# Patient Record
Sex: Female | Born: 1990 | Race: White | Hispanic: No | Marital: Single | Smoking: Never smoker
Health system: Southern US, Community
[De-identification: ages and names within clinical notes are randomized; demographics above are authoritative.]

## PROBLEM LIST (undated history)

## (undated) DIAGNOSIS — F909 Attention-deficit hyperactivity disorder, unspecified type: Secondary | ICD-10-CM

## (undated) DIAGNOSIS — T753XXA Motion sickness, initial encounter: Secondary | ICD-10-CM

## (undated) DIAGNOSIS — G43909 Migraine, unspecified, not intractable, without status migrainosus: Secondary | ICD-10-CM

## (undated) HISTORY — PX: TYMPANOSTOMY TUBE PLACEMENT: SHX32

## (undated) HISTORY — PX: ADENOIDECTOMY: SUR15

---
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
## 2012-02-04 ENCOUNTER — Emergency Department: Payer: Self-pay | Admitting: *Deleted

## 2012-02-04 LAB — CBC
HCT: 43.1 % (ref 35.0–47.0)
HGB: 14.5 g/dL (ref 12.0–16.0)
MCH: 32.2 pg (ref 26.0–34.0)
Platelet: 238 10*3/uL (ref 150–440)
RBC: 4.51 10*6/uL (ref 3.80–5.20)
RDW: 12.4 % (ref 11.5–14.5)
WBC: 7.1 10*3/uL (ref 3.6–11.0)

## 2012-02-04 LAB — COMPREHENSIVE METABOLIC PANEL
Alkaline Phosphatase: 51 U/L — ABNORMAL LOW (ref 82–169)
Bilirubin,Total: 0.6 mg/dL (ref 0.2–1.0)
Calcium, Total: 9 mg/dL (ref 9.0–10.7)
Chloride: 105 mmol/L (ref 97–107)
Creatinine: 0.71 mg/dL (ref 0.60–1.30)
Glucose: 82 mg/dL (ref 65–99)
Osmolality: 282 (ref 275–301)
Potassium: 3.9 mmol/L (ref 3.3–4.7)
SGOT(AST): 23 U/L (ref 0–26)
SGPT (ALT): 26 U/L

## 2012-02-05 LAB — URINALYSIS, COMPLETE
Bacteria: NONE SEEN
Bilirubin,UR: NEGATIVE
Blood: NEGATIVE
Glucose,UR: NEGATIVE mg/dL (ref 0–75)
Ketone: NEGATIVE
Leukocyte Esterase: NEGATIVE
Protein: NEGATIVE
RBC,UR: NONE SEEN /HPF (ref 0–5)
WBC UR: NONE SEEN /HPF (ref 0–5)

## 2012-02-05 LAB — PREGNANCY, URINE: Pregnancy Test, Urine: NEGATIVE m[IU]/mL

## 2012-02-22 ENCOUNTER — Ambulatory Visit: Payer: Self-pay | Admitting: Pediatrics

## 2012-07-12 ENCOUNTER — Ambulatory Visit: Payer: Self-pay | Admitting: General Surgery

## 2012-07-12 LAB — CBC WITH DIFFERENTIAL/PLATELET
Basophil #: 0 10*3/uL (ref 0.0–0.1)
HCT: 42.4 % (ref 35.0–47.0)
HGB: 14.5 g/dL (ref 12.0–16.0)
Lymphocyte %: 38.9 %
MCH: 32.1 pg (ref 26.0–34.0)
MCV: 94 fL (ref 80–100)
Monocyte %: 9.2 %
Neutrophil %: 49.2 %
RBC: 4.5 10*6/uL (ref 3.80–5.20)

## 2012-07-14 ENCOUNTER — Ambulatory Visit: Payer: Self-pay | Admitting: General Surgery

## 2012-07-14 HISTORY — PX: APPENDECTOMY: SHX54

## 2012-07-17 LAB — PATHOLOGY REPORT

## 2012-12-19 IMAGING — NM NUCLEAR MEDICINE HEPATOHBILIARY INCLUDE GB
2 series · 12 of 12 positions shown · non-contrast
Comparison: none

REASON FOR EXAM: abd pain
COMMENTS:

[Series 1000: gallbladder dynamic (results) · 4.80mm/px · 6 of 60 frames shown]
[frame 6/60]
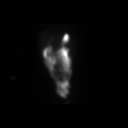
[frame 16/60]
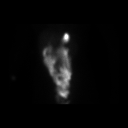
[frame 26/60]
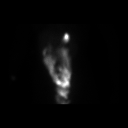
[frame 36/60]
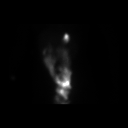
[frame 46/60]
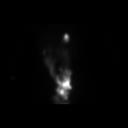
[frame 56/60]
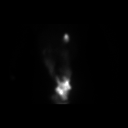

[Series 1000: gallbladder dynamic · 4.80mm/px · 6 of 60 frames shown]
[frame 6/60]
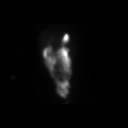
[frame 16/60]
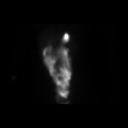
[frame 26/60]
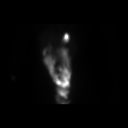
[frame 36/60]
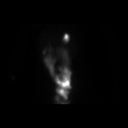
[frame 46/60]
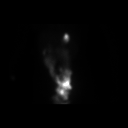
[frame 56/60]
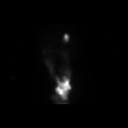

[12 of 12 positions shown; findings below may reference images not displayed]

PROCEDURE:     KNM - KNM HEPATO W/GB EJECT FRACTION  - February 22, 2012  [DATE]

RESULT:     Following intravenous administration of 8.08 mCi technetium 99m
Choletec, there is noted prompt visualization of tracer activity in the
liver at 3 minutes. At 30 minutes, tracer activity is visualized in the
gallbladder, common duct and proximal small bowel.

The gallbladder ejection fraction measures 84% which is in the normal range.
IMPRESSION: 1.  Normal Hepatobiliary Scan.
2.  The gallbladder ejection fraction measures 84% which is in the normal
range.

[REDACTED]

## 2017-03-24 NOTE — Discharge Instructions (Signed)
[facility] °[facility] °ENDOSCOPIC SINUS SURGERY °[facility] ° °What is Functional Endoscopic Sinus Surgery? ° The Surgery involves making the natural openings of the sinuses larger by removing the bony partitions that separate the sinuses from the nasal cavity.  The natural sinus lining is preserved as much as possible to allow the sinuses to resume normal function after the surgery.  In some patients nasal polyps (excessively swollen lining of the sinuses) may be removed to relieve obstruction of the sinus openings.  The surgery is performed through the nose using lighted scopes, which eliminates the need for incisions on the face.  A septoplasty is a different procedure which is sometimes performed with sinus surgery.  It involves straightening the boy partition that separates the two sides of your nose.  A crooked or deviated septum may need repair if is obstructing the sinuses or nasal airflow.  Turbinate reduction is also often performed during sinus surgery.  The turbinates are bony proturberances from the side walls of the nose which swell and can obstruct the nose in patients with sinus and allergy problems.  Their size can be surgically reduced to help relieve nasal obstruction. ° °What Can Sinus Surgery Do For Me? ° Sinus surgery can reduce the frequency of sinus infections requiring antibiotic treatment.  This can provide improvement in nasal congestion, post-nasal drainage, facial pressure and nasal obstruction.  Surgery will NOT prevent you from ever having an infection again, so it usually only for patients who get infections 4 or more times yearly requiring antibiotics, or for infections that do not clear with antibiotics.  It will not cure nasal allergies, so patients with allergies may still require medication to treat their allergies after surgery. Surgery may improve headaches related to sinusitis, however, some people will continue to  require medication to control sinus headaches related to allergies.  Surgery will do nothing for other forms of headache (migraine, tension or cluster). ° °What Are the Risks of Endoscopic Sinus Surgery? ° Current techniques allow surgery to be performed safely with little risk, however, there are rare complications that patients should be aware of.  Because the sinuses are located around the eyes, there is risk of eye injury, including blindness, though again, this would be quite rare. This is usually a result of bleeding behind the eye during surgery, which puts the vision oat risk, though there are treatments to protect the vision and prevent permanent disrupted by surgery causing a leak of the spinal fluid that surrounds the brain.  More serious complications would include bleeding inside the brain cavity or damage to the brain.  Again, all of these complications are uncommon, and spinal fluid leaks can be safely managed surgically if they occur.  The most common complication of sinus surgery is bleeding from the nose, which may require packing or cauterization of the nose.  Continued sinus have polyps may experience recurrence of the polyps requiring revision surgery.  Alterations of sense of smell or injury to the tear ducts are also rare complications.  ° °What is the Surgery Like, and what is the Recovery? ° The Surgery usually takes a couple of hours to perform, and is usually performed under a general anesthetic (completely asleep).  Patients are usually discharged home after a couple of hours.  Sometimes during surgery it is necessary to pack the nose to control bleeding, and the packing is left in place for 24 - 48 hours, and removed by your surgeon.    If a septoplasty was performed during the procedure, there is often a splint placed which must be removed after 5-7 days.   °Discomfort: Pain is usually mild to moderate, and can be controlled by prescription pain medication or acetaminophen (Tylenol).   Aspirin, Ibuprofen (Advil, Motrin), or Naprosyn (Aleve) should be avoided, as they can cause increased bleeding.  Most patients feel sinus pressure like they have a bad head cold for several days.  Sleeping with your head elevated can help reduce swelling and facial pressure, as can ice packs over the face.  A humidifier may be helpful to keep the mucous and blood from drying in the nose.  ° °Diet: There are no specific diet restrictions, however, you should generally start with clear liquids and a light diet of bland foods because the anesthetic can cause some nausea.  Advance your diet depending on how your stomach feels.  Taking your pain medication with food will often help reduce stomach upset which pain medications can cause. ° °Nasal Saline Irrigation: It is important to remove blood clots and dried mucous from the nose as it is healing.  This is done by having you irrigate the nose at least 3 - 4 times daily with a salt water solution.  We recommend using NeilMed Sinus Rinse (available at the drug store).  Fill the squeeze bottle with the solution, bend over a sink, and insert the tip of the squeeze bottle into the nose ½ of an inch.  Point the tip of the squeeze bottle towards the inside corner of the eye on the same side your irrigating.  Squeeze the bottle and gently irrigate the nose.  If you bend forward as you do this, most of the fluid will flow back out of the nose, instead of down your throat.   The solution should be warm, near body temperature, when you irrigate.   Each time you irrigate, you should use a full squeeze bottle.  ° °Note that if you are instructed to use Nasal Steroid Sprays at any time after your surgery, irrigate with saline BEFORE using the steroid spray, so you do not wash it all out of the nose. °Another product, Nasal Saline Gel (such as AYR Nasal Saline Gel) can be applied in each nostril 3 - 4 times daily to moisture the nose and reduce scabbing or crusting. ° °Bleeding:   Bloody drainage from the nose can be expected for several days, and patients are instructed to irrigate their nose frequently with salt water to help remove mucous and blood clots.  The drainage may be dark red or brown, though some fresh blood may be seen intermittently, especially after irrigation.  Do not blow you nose, as bleeding may occur. If you must sneeze, keep your mouth open to allow air to escape through your mouth. ° °If heavy bleeding occurs: Irrigate the nose with saline to rinse out clots, then spray the nose 3 - 4 times with Afrin Nasal Decongestant Spray.  The spray will constrict the blood vessels to slow bleeding.  Pinch the lower half of your nose shut to apply pressure, and lay down with your head elevated.  Ice packs over the nose may help as well. If bleeding persists despite these measures, you should notify your doctor.  Do not use the Afrin routinely to control nasal congestion after surgery, as it can result in worsening congestion and may affect healing.  ° ° ° °Activity: Return to work varies among patients. Most patients will be   out of work at least 5 - 7 days to recover.  Patient may return to work after they are off of narcotic pain medication, and feeling well enough to perform the functions of their job.  Patients must avoid heavy lifting (over 10 pounds) or strenuous physical for 2 weeks after surgery, so your employer may need to assign you to light duty, or keep you out of work longer if light duty is not possible.  NOTE: you should not drive, operate dangerous machinery, do any mentally demanding tasks or make any important legal or financial decisions while on narcotic pain medication and recovering from the general anesthetic.  °  °Call Your Doctor Immediately if You Have Any of the Following: °1. Bleeding that you cannot control with the above measures °2. Loss of vision, double vision, bulging of the eye or black eyes. °3. Fever over 101 degrees °4. Neck stiffness with  severe headache, fever, nausea and change in mental state. °You are always encourage to call anytime with concerns, however, please call with requests for pain medication refills during office hours. ° °Office Endoscopy: During follow-up visits your doctor will remove any packing or splints that may have been placed and evaluate and clean your sinuses endoscopically.  Topical anesthetic will be used to make this as comfortable as possible, though you may want to take your pain medication prior to the visit.  How often this will need to be done varies from patient to patient.  After complete recovery from the surgery, you may need follow-up endoscopy from time to time, particularly if there is concern of recurrent infection or nasal polyps. ° ° °General Anesthesia, Adult, Care After °These instructions provide you with information about caring for yourself after your procedure. Your health care provider may also give you more specific instructions. Your treatment has been planned according to current medical practices, but problems sometimes occur. Call your health care provider if you have any problems or questions after your procedure. °What can I expect after the procedure? °After the procedure, it is common to have: °· Vomiting. °· A sore throat. °· Mental slowness. ° °It is common to feel: °· Nauseous. °· Cold or shivery. °· Sleepy. °· Tired. °· Sore or achy, even in parts of your body where you did not have surgery. ° °Follow these instructions at home: °For at least 24 hours after the procedure: °· Do not: °? Participate in activities where you could fall or become injured. °? Drive. °? Use heavy machinery. °? Drink alcohol. °? Take sleeping pills or medicines that cause drowsiness. °? Make important decisions or sign legal documents. °? Take care of children on your own. °· Rest. °Eating and drinking °· If you vomit, drink water, juice, or soup when you can drink without vomiting. °· Drink enough fluid to  keep your urine clear or pale yellow. °· Make sure you have little or no nausea before eating solid foods. °· Follow the diet recommended by your health care provider. °General instructions °· Have a responsible adult stay with you until you are awake and alert. °· Return to your normal activities as told by your health care provider. Ask your health care provider what activities are safe for you. °· Take over-the-counter and prescription medicines only as told by your health care provider. °· If you smoke, do not smoke without supervision. °· Keep all follow-up visits as told by your health care provider. This is important. °Contact a health care provider if: °· You   continue to have nausea or vomiting at home, and medicines are not helpful. °· You cannot drink fluids or start eating again. °· You cannot urinate after 8-12 hours. °· You develop a skin rash. °· You have fever. °· You have increasing redness at the site of your procedure. °Get help right away if: °· You have difficulty breathing. °· You have chest pain. °· You have unexpected bleeding. °· You feel that you are having a life-threatening or urgent problem. °This information is not intended to replace advice given to you by your health care provider. Make sure you discuss any questions you have with your health care provider. °Document Released: 12/13/2000 Document Revised: 02/09/2016 Document Reviewed: 08/21/2015 °Elsevier Interactive Patient Education © 2018 [employer] ° °

## 2017-03-28 ENCOUNTER — Encounter: Payer: Self-pay | Admitting: *Deleted

## 2017-03-28 ENCOUNTER — Encounter: Payer: Self-pay | Admitting: Anesthesiology

## 2017-03-31 ENCOUNTER — Ambulatory Visit: Admission: RE | Admit: 2017-03-31 | Payer: BC Managed Care – PPO | Source: Ambulatory Visit | Admitting: Otolaryngology

## 2017-03-31 HISTORY — DX: Attention-deficit hyperactivity disorder, unspecified type: F90.9

## 2017-03-31 HISTORY — DX: Motion sickness, initial encounter: T75.3XXA

## 2017-03-31 HISTORY — DX: Migraine, unspecified, not intractable, without status migrainosus: G43.909

## 2017-03-31 SURGERY — SEPTOPLASTY, NOSE
Anesthesia: General

## 2017-09-05 ENCOUNTER — Encounter: Payer: Self-pay | Admitting: *Deleted

## 2017-09-05 ENCOUNTER — Other Ambulatory Visit: Payer: Self-pay

## 2017-09-07 NOTE — Discharge Instructions (Signed)
[facility] °[facility] °ENDOSCOPIC SINUS SURGERY °[facility] ° °What is Functional Endoscopic Sinus Surgery? ° The Surgery involves making the natural openings of the sinuses larger by removing the bony partitions that separate the sinuses from the nasal cavity.  The natural sinus lining is preserved as much as possible to allow the sinuses to resume normal function after the surgery.  In some patients nasal polyps (excessively swollen lining of the sinuses) may be removed to relieve obstruction of the sinus openings.  The surgery is performed through the nose using lighted scopes, which eliminates the need for incisions on the face.  A septoplasty is a different procedure which is sometimes performed with sinus surgery.  It involves straightening the boy partition that separates the two sides of your nose.  A crooked or deviated septum may need repair if is obstructing the sinuses or nasal airflow.  Turbinate reduction is also often performed during sinus surgery.  The turbinates are bony proturberances from the side walls of the nose which swell and can obstruct the nose in patients with sinus and allergy problems.  Their size can be surgically reduced to help relieve nasal obstruction. ° °What Can Sinus Surgery Do For Me? ° Sinus surgery can reduce the frequency of sinus infections requiring antibiotic treatment.  This can provide improvement in nasal congestion, post-nasal drainage, facial pressure and nasal obstruction.  Surgery will NOT prevent you from ever having an infection again, so it usually only for patients who get infections 4 or more times yearly requiring antibiotics, or for infections that do not clear with antibiotics.  It will not cure nasal allergies, so patients with allergies may still require medication to treat their allergies after surgery. Surgery may improve headaches related to sinusitis, however, some people will continue to  require medication to control sinus headaches related to allergies.  Surgery will do nothing for other forms of headache (migraine, tension or cluster). ° °What Are the Risks of Endoscopic Sinus Surgery? ° Current techniques allow surgery to be performed safely with little risk, however, there are rare complications that patients should be aware of.  Because the sinuses are located around the eyes, there is risk of eye injury, including blindness, though again, this would be quite rare. This is usually a result of bleeding behind the eye during surgery, which puts the vision oat risk, though there are treatments to protect the vision and prevent permanent disrupted by surgery causing a leak of the spinal fluid that surrounds the brain.  More serious complications would include bleeding inside the brain cavity or damage to the brain.  Again, all of these complications are uncommon, and spinal fluid leaks can be safely managed surgically if they occur.  The most common complication of sinus surgery is bleeding from the nose, which may require packing or cauterization of the nose.  Continued sinus have polyps may experience recurrence of the polyps requiring revision surgery.  Alterations of sense of smell or injury to the tear ducts are also rare complications.  ° °What is the Surgery Like, and what is the Recovery? ° The Surgery usually takes a couple of hours to perform, and is usually performed under a general anesthetic (completely asleep).  Patients are usually discharged home after a couple of hours.  Sometimes during surgery it is necessary to pack the nose to control bleeding, and the packing is left in place for 24 - 48 hours, and removed by your surgeon.    If a septoplasty was performed during the procedure, there is often a splint placed which must be removed after 5-7 days.   °Discomfort: Pain is usually mild to moderate, and can be controlled by prescription pain medication or acetaminophen (Tylenol).   Aspirin, Ibuprofen (Advil, Motrin), or Naprosyn (Aleve) should be avoided, as they can cause increased bleeding.  Most patients feel sinus pressure like they have a bad head cold for several days.  Sleeping with your head elevated can help reduce swelling and facial pressure, as can ice packs over the face.  A humidifier may be helpful to keep the mucous and blood from drying in the nose.  ° °Diet: There are no specific diet restrictions, however, you should generally start with clear liquids and a light diet of bland foods because the anesthetic can cause some nausea.  Advance your diet depending on how your stomach feels.  Taking your pain medication with food will often help reduce stomach upset which pain medications can cause. ° °Nasal Saline Irrigation: It is important to remove blood clots and dried mucous from the nose as it is healing.  This is done by having you irrigate the nose at least 3 - 4 times daily with a salt water solution.  We recommend using NeilMed Sinus Rinse (available at the drug store).  Fill the squeeze bottle with the solution, bend over a sink, and insert the tip of the squeeze bottle into the nose ½ of an inch.  Point the tip of the squeeze bottle towards the inside corner of the eye on the same side your irrigating.  Squeeze the bottle and gently irrigate the nose.  If you bend forward as you do this, most of the fluid will flow back out of the nose, instead of down your throat.   The solution should be warm, near body temperature, when you irrigate.   Each time you irrigate, you should use a full squeeze bottle.  ° °Note that if you are instructed to use Nasal Steroid Sprays at any time after your surgery, irrigate with saline BEFORE using the steroid spray, so you do not wash it all out of the nose. °Another product, Nasal Saline Gel (such as AYR Nasal Saline Gel) can be applied in each nostril 3 - 4 times daily to moisture the nose and reduce scabbing or crusting. ° °Bleeding:   Bloody drainage from the nose can be expected for several days, and patients are instructed to irrigate their nose frequently with salt water to help remove mucous and blood clots.  The drainage may be dark red or brown, though some fresh blood may be seen intermittently, especially after irrigation.  Do not blow you nose, as bleeding may occur. If you must sneeze, keep your mouth open to allow air to escape through your mouth. ° °If heavy bleeding occurs: Irrigate the nose with saline to rinse out clots, then spray the nose 3 - 4 times with Afrin Nasal Decongestant Spray.  The spray will constrict the blood vessels to slow bleeding.  Pinch the lower half of your nose shut to apply pressure, and lay down with your head elevated.  Ice packs over the nose may help as well. If bleeding persists despite these measures, you should notify your doctor.  Do not use the Afrin routinely to control nasal congestion after surgery, as it can result in worsening congestion and may affect healing.  ° ° ° °Activity: Return to work varies among patients. Most patients will be   out of work at least 5 - 7 days to recover.  Patient may return to work after they are off of narcotic pain medication, and feeling well enough to perform the functions of their job.  Patients must avoid heavy lifting (over 10 pounds) or strenuous physical for 2 weeks after surgery, so your employer may need to assign you to light duty, or keep you out of work longer if light duty is not possible.  NOTE: you should not drive, operate dangerous machinery, do any mentally demanding tasks or make any important legal or financial decisions while on narcotic pain medication and recovering from the general anesthetic.  °  °Call Your Doctor Immediately if You Have Any of the Following: °1. Bleeding that you cannot control with the above measures °2. Loss of vision, double vision, bulging of the eye or black eyes. °3. Fever over 101 degrees °4. Neck stiffness with  severe headache, fever, nausea and change in mental state. °You are always encourage to call anytime with concerns, however, please call with requests for pain medication refills during office hours. ° °Office Endoscopy: During follow-up visits your doctor will remove any packing or splints that may have been placed and evaluate and clean your sinuses endoscopically.  Topical anesthetic will be used to make this as comfortable as possible, though you may want to take your pain medication prior to the visit.  How often this will need to be done varies from patient to patient.  After complete recovery from the surgery, you may need follow-up endoscopy from time to time, particularly if there is concern of recurrent infection or nasal polyps. ° ° °General Anesthesia, Adult, Care After °These instructions provide you with information about caring for yourself after your procedure. Your health care provider may also give you more specific instructions. Your treatment has been planned according to current medical practices, but problems sometimes occur. Call your health care provider if you have any problems or questions after your procedure. °What can I expect after the procedure? °After the procedure, it is common to have: °· Vomiting. °· A sore throat. °· Mental slowness. ° °It is common to feel: °· Nauseous. °· Cold or shivery. °· Sleepy. °· Tired. °· Sore or achy, even in parts of your body where you did not have surgery. ° °Follow these instructions at home: °For at least 24 hours after the procedure: °· Do not: °? Participate in activities where you could fall or become injured. °? Drive. °? Use heavy machinery. °? Drink alcohol. °? Take sleeping pills or medicines that cause drowsiness. °? Make important decisions or sign legal documents. °? Take care of children on your own. °· Rest. °Eating and drinking °· If you vomit, drink water, juice, or soup when you can drink without vomiting. °· Drink enough fluid to  keep your urine clear or pale yellow. °· Make sure you have little or no nausea before eating solid foods. °· Follow the diet recommended by your health care provider. °General instructions °· Have a responsible adult stay with you until you are awake and alert. °· Return to your normal activities as told by your health care provider. Ask your health care provider what activities are safe for you. °· Take over-the-counter and prescription medicines only as told by your health care provider. °· If you smoke, do not smoke without supervision. °· Keep all follow-up visits as told by your health care provider. This is important. °Contact a health care provider if: °· You   continue to have nausea or vomiting at home, and medicines are not helpful. °· You cannot drink fluids or start eating again. °· You cannot urinate after 8-12 hours. °· You develop a skin rash. °· You have fever. °· You have increasing redness at the site of your procedure. °Get help right away if: °· You have difficulty breathing. °· You have chest pain. °· You have unexpected bleeding. °· You feel that you are having a life-threatening or urgent problem. °This information is not intended to replace advice given to you by your health care provider. Make sure you discuss any questions you have with your health care provider. °Document Released: 12/13/2000 Document Revised: 02/09/2016 Document Reviewed: 08/21/2015 °Elsevier Interactive Patient Education © 2018 [employer] ° °

## 2017-09-08 ENCOUNTER — Encounter
Admission: RE | Disposition: A | Discharge status: Home / Self Care | Payer: Self-pay | Source: Ambulatory Visit | Attending: Otolaryngology

## 2017-09-08 ENCOUNTER — Ambulatory Visit: Payer: BC Managed Care – PPO | Admitting: Anesthesiology

## 2017-09-08 ENCOUNTER — Ambulatory Visit
Admission: RE | Admit: 2017-09-08 | Discharge: 2017-09-08 | Disposition: A | Discharge status: Home / Self Care | Payer: BC Managed Care – PPO | Source: Ambulatory Visit | Attending: Otolaryngology | Admitting: Otolaryngology

## 2017-09-08 DIAGNOSIS — Z793 Long term (current) use of hormonal contraceptives: Secondary | ICD-10-CM | POA: Insufficient documentation

## 2017-09-08 DIAGNOSIS — Z79899 Other long term (current) drug therapy: Secondary | ICD-10-CM | POA: Insufficient documentation

## 2017-09-08 DIAGNOSIS — J342 Deviated nasal septum: Secondary | ICD-10-CM | POA: Diagnosis not present

## 2017-09-08 DIAGNOSIS — Z9889 Other specified postprocedural states: Secondary | ICD-10-CM | POA: Diagnosis not present

## 2017-09-08 DIAGNOSIS — J338 Other polyp of sinus: Secondary | ICD-10-CM | POA: Diagnosis not present

## 2017-09-08 HISTORY — PX: SEPTOPLASTY: SHX2393

## 2017-09-08 HISTORY — PX: POLYPECTOMY: SHX149

## 2017-09-08 SURGERY — SEPTOPLASTY, NOSE
Anesthesia: General | Site: Nose | Wound class: Clean Contaminated

## 2017-09-08 MED ORDER — FENTANYL CITRATE (PF) 100 MCG/2ML IJ SOLN
INTRAMUSCULAR | Status: DC | PRN
  Administered 2017-09-08: 100 ug via INTRAVENOUS

## 2017-09-08 MED ORDER — CEFAZOLIN SODIUM-DEXTROSE 2-4 GM/100ML-% IV SOLN
2.0000 g | Freq: Once | INTRAVENOUS | Status: AC
  Administered 2017-09-08: 2 g via INTRAVENOUS

## 2017-09-08 MED ORDER — FENTANYL CITRATE (PF) 100 MCG/2ML IJ SOLN
25.0000 ug | INTRAMUSCULAR | Status: DC | PRN
  Administered 2017-09-08 (×2): 25 ug via INTRAVENOUS

## 2017-09-08 MED ORDER — SCOPOLAMINE 1 MG/3DAYS TD PT72
1.0000 | MEDICATED_PATCH | TRANSDERMAL | Status: DC
  Administered 2017-09-08: 1.5 mg via TRANSDERMAL

## 2017-09-08 MED ORDER — MIDAZOLAM HCL 5 MG/5ML IJ SOLN
INTRAMUSCULAR | Status: DC | PRN
  Administered 2017-09-08: 2 mg via INTRAVENOUS

## 2017-09-08 MED ORDER — ONDANSETRON HCL 4 MG/2ML IJ SOLN
INTRAMUSCULAR | Status: DC | PRN
  Administered 2017-09-08: 4 mg via INTRAVENOUS

## 2017-09-08 MED ORDER — LIDOCAINE HCL (CARDIAC) 20 MG/ML IV SOLN
INTRAVENOUS | Status: DC | PRN
  Administered 2017-09-08: 40 mg via INTRAVENOUS

## 2017-09-08 MED ORDER — LIDOCAINE-EPINEPHRINE (PF) 1 %-1:200000 IJ SOLN
INTRAMUSCULAR | Status: DC | PRN
  Administered 2017-09-08: 3 mL via INTRADERMAL

## 2017-09-08 MED ORDER — PROPOFOL 10 MG/ML IV BOLUS
INTRAVENOUS | Status: DC | PRN
  Administered 2017-09-08: 130 mg via INTRAVENOUS

## 2017-09-08 MED ORDER — LIDOCAINE HCL 1 % IJ SOLN
INTRAMUSCULAR | Status: DC | PRN
  Administered 2017-09-08: 15 mL via TOPICAL

## 2017-09-08 MED ORDER — SUCCINYLCHOLINE CHLORIDE 20 MG/ML IJ SOLN
INTRAMUSCULAR | Status: DC | PRN
  Administered 2017-09-08: 80 mg via INTRAVENOUS

## 2017-09-08 MED ORDER — GLYCOPYRROLATE 0.2 MG/ML IJ SOLN
INTRAMUSCULAR | Status: DC | PRN
  Administered 2017-09-08: 0.1 mg via INTRAVENOUS

## 2017-09-08 MED ORDER — LIDOCAINE HCL 4 % MT SOLN
OROMUCOSAL | Status: DC | PRN
  Administered 2017-09-08: 4 mL via TOPICAL

## 2017-09-08 MED ORDER — OXYMETAZOLINE HCL 0.05 % NA SOLN
2.0000 | Freq: Once | NASAL | Status: AC
  Administered 2017-09-08: 2 via NASAL

## 2017-09-08 MED ORDER — LACTATED RINGERS IV SOLN
INTRAVENOUS | Status: DC | PRN
  Administered 2017-09-08: 09:00:00 via INTRAVENOUS

## 2017-09-08 MED ORDER — ONDANSETRON HCL 4 MG/2ML IJ SOLN: 4.0000 mg | Freq: Once | INTRAMUSCULAR | Status: DC | PRN

## 2017-09-08 MED ORDER — OXYCODONE HCL 5 MG/5ML PO SOLN: 5.0000 mg | Freq: Once | ORAL | Status: DC | PRN

## 2017-09-08 MED ORDER — DEXAMETHASONE SODIUM PHOSPHATE 4 MG/ML IJ SOLN
INTRAMUSCULAR | Status: DC | PRN
  Administered 2017-09-08: 10 mg via INTRAVENOUS

## 2017-09-08 MED ORDER — OXYCODONE HCL 5 MG PO TABS: 5.0000 mg | ORAL_TABLET | Freq: Once | ORAL | Status: DC | PRN

## 2017-09-08 MED ORDER — ACETAMINOPHEN 10 MG/ML IV SOLN
1000.0000 mg | Freq: Once | INTRAVENOUS | Status: AC
  Administered 2017-09-08: 1000 mg via INTRAVENOUS

## 2017-09-08 SURGICAL SUPPLY — 21 items
CANISTER SUCT 1200ML W/VALVE (MISCELLANEOUS) ×4 IMPLANT
COAGULATOR SUCT 8FR VV (MISCELLANEOUS) ×4 IMPLANT
DRAPE HEAD BAR (DRAPES) ×4 IMPLANT
GLOVE PI ULTRA LF STRL 7.5 (GLOVE) ×4 IMPLANT
GLOVE PI ULTRA NON LATEX 7.5 (GLOVE) ×4
KIT ROOM TURNOVER OR (KITS) ×4 IMPLANT
NEEDLE ANESTHESIA  27G X 3.5 (NEEDLE) ×2
NEEDLE ANESTHESIA 27G X 3.5 (NEEDLE) ×2 IMPLANT
PACK DRAPE NASAL/ENT (PACKS) ×4 IMPLANT
PAD GROUND ADULT SPLIT (MISCELLANEOUS) ×4 IMPLANT
PATTIES SURGICAL .5 X3 (DISPOSABLE) ×4 IMPLANT
SPLINT NASAL SEPTAL BLV .50 ST (MISCELLANEOUS) ×4 IMPLANT
STRAP BODY AND KNEE 60X3 (MISCELLANEOUS) ×4 IMPLANT
SUT CHROMIC 3-0 (SUTURE) ×2
SUT CHROMIC 3-0 KS 27XMFL CR (SUTURE) ×2
SUT ETHILON 3-0 KS 30 BLK (SUTURE) ×4 IMPLANT
SUT PLAIN GUT 4-0 (SUTURE) ×4 IMPLANT
SUTURE CHRMC 3-0 KS 27XMFL CR (SUTURE) ×2 IMPLANT
SYR 3ML LL SCALE MARK (SYRINGE) ×4 IMPLANT
TOWEL OR 17X26 4PK STRL BLUE (TOWEL DISPOSABLE) ×4 IMPLANT
WATER STERILE IRR 250ML POUR (IV SOLUTION) ×4 IMPLANT

## 2017-09-08 NOTE — Op Note (Signed)
09/08/2017  9:43 AM    Gerhard PerchesEbert, Maudy  [id number]   Pre-Op Dx:  Septal deviation causing airway obstruction  Post-op Dx: Septal deviation causing airway obstruction, left maxillary antral polyp into airway  Proc: Septoplasty, excision left maxillary antral polyp   Surg:  Beverly SessionsPaul H Naithan Delage  Anes:  GOT  EBL:  50 mL  Comp:  None  Findings:  The septum had been previously straightened. Recent trauma had buckled the anterior inferior septum to the left side. This was causing airway obstruction. Once this was straightened and cleared the left nasal passage showed a large polyp hanging out of the left maxillary antrum and filling much of the posterior nasal airway. This at its root in the inferior lateral maxillary sinus and was removed from the sinus and nose. This was 1 large watery polyp that was coming out of a previously opened large maxillary antrum.  Procedure: The patient was brought to the operating room and given general anesthesia by oral endotracheal intubation. She is placed in supine position. The nose was prepped using 6 mL of 1% Xylocaine with epi 1-100,000 for infiltration into the anterior septum. Cotton pledgets soaked in phenylephrine and Xylocaine were placed in both sides the nose for further vasoconstriction. After draped sterile fashion.   The nasal passages were visualized in the anterior septum was buckled to the left side inferiorly. The right side appeared to be much more open. A left Killian incision was created with elevation of mucoperichondrium on the left side of the quadrangular plate. The cartilage inferiorly was overhanging some and was freed up overlying the maxillary crest. Some of the vomer and ethmoid plate were previously removed and the dissection was only carried as far back as the end of the quadrangular plate. An inferior rim of cartilage was then incised in the mucoperichondrium was elevated on the right side of this cartilage. This was removed from  overhanging the maxillary crest on the left side. The mucosal flaps were then placed back in their anatomic position in the left nasal airway was much more open here. The maxillary crest itself was reasonably in the midline. A 3-0 chromic suture was used to through and through whip stitch fashion to anchor the inferior septum at the anterior nasal spine. This was used to close some of the [facility] inferior[facility]. A 40 plain gut suture on a mini Keith needle was then used to help hold the mucosal flaps together in the midline. This close the [facility] incision as well.  The left nasal passage was then visualized and there was a large polyp coming out of the middle meatus that was filling much of the posterior nasal passageway. There is appeared to be one large polyp coming out of a previously opened large maxillary antrum. I used the 0 and 30 scopes to visualize this better. No other polypoid changes were noted. And ethmoid forcep was used to grasp the neck of the polyp at the maxillary antral opening and to slowly remove it from the sinus. A large portion of this broke off with still some mucosa in the sinus. This mucosa was then grasped and the rest of the polyp from the maxillary sinus then was removed as well. The 30 scope could visualize sinus it was perfectly clear now and the polyp was completely removed. Airway was much more open in the nose now. There was no significant bleeding and no packing was required at all.  Both sides were visualized. No nasal splints were placed as  the septum was in the midline and stable. The left maxillary sinus was completely cleared and there is no bleeding noted there. The patient was awakened taken to the recovery room in satisfactory condition. There were no operative complications.  Dispo:   To PACU to be discharged home  Plan:  To follow-up in the office in 1 week to make sure she is doing well. She'll use saline flushes at home and some Vaseline along the  anterior septum on both sides to help lubricate this. We'll give some tramadol for pain because she gets nauseated with codeine. We'll use some antibiotics postop as well for prevention.  Beverly Sessionsaul H Kaisa Wofford  09/08/2017 9:43 AM

## 2017-09-08 NOTE — Anesthesia Preprocedure Evaluation (Addendum)
Anesthesia Evaluation  Patient identified by MRN, date of birth, ID band Patient awake    Reviewed: Allergy & Precautions, H&P , NPO status , Patient's Chart, lab work & pertinent test results, reviewed documented beta blocker date and time   Airway Mallampati: I  TM Distance: >3 FB Neck ROM: full    Dental no notable dental hx.    Pulmonary neg pulmonary ROS,    Pulmonary exam normal breath sounds clear to auscultation       Cardiovascular Exercise Tolerance: Good negative cardio ROS   Rhythm:regular Rate:Normal     Neuro/Psych negative neurological ROS  negative psych ROS   GI/Hepatic negative GI ROS, Neg liver ROS,   Endo/Other  negative endocrine ROS  Renal/GU negative Renal ROS  negative genitourinary   Musculoskeletal   Abdominal   Peds  Hematology negative hematology ROS (+)   Anesthesia Other Findings   Reproductive/Obstetrics negative OB ROS                            Anesthesia Physical Anesthesia Plan  ASA: I  Anesthesia Plan: General ETT   Post-op Pain Management:    Induction:   PONV Risk Score and Plan: Ondansetron, Dexamethasone, Scopolamine patch - Pre-op and Midazolam  Airway Management Planned:   Additional Equipment:   Intra-op Plan:   Post-operative Plan:   Informed Consent: I have reviewed the patients History and Physical, chart, labs and discussed the procedure including the risks, benefits and alternatives for the proposed anesthesia with the patient or authorized representative who has indicated his/her understanding and acceptance.     Plan Discussed with: CRNA  Anesthesia Plan Comments:         Anesthesia Quick Evaluation

## 2017-09-08 NOTE — H&P (Signed)
H&P has been reviewedand patient reevaluated,  and no changes necessary. To be downloaded later.  

## 2017-09-08 NOTE — Transfer of Care (Signed)
Immediate Anesthesia Transfer of Care Note  Patient: Pennie RushingMegan J Dewberry  Procedure(s) Performed: SEPTOPLASTY (N/A Nose) POLYPECTOMY NASAL  Patient Location: PACU  Anesthesia Type: General ETT  Level of Consciousness: awake, alert  and patient cooperative  Airway and Oxygen Therapy: Patient Spontanous Breathing and Patient connected to supplemental oxygen  Post-op Assessment: Post-op Vital signs reviewed, Patient's Cardiovascular Status Stable, Respiratory Function Stable, Patent Airway and No signs of Nausea or vomiting  Post-op Vital Signs: Reviewed and stable  Complications: No apparent anesthesia complications

## 2017-09-08 NOTE — Anesthesia Postprocedure Evaluation (Signed)
Anesthesia Post Note  Patient: Lydia RushingMegan J Pacha  Procedure(s) Performed: SEPTOPLASTY (N/A Nose) POLYPECTOMY NASAL  Patient location during evaluation: PACU Anesthesia Type: General Level of consciousness: awake and alert Pain management: pain level controlled Vital Signs Assessment: post-procedure vital signs reviewed and stable Respiratory status: spontaneous breathing, nonlabored ventilation, respiratory function stable and patient connected to nasal cannula oxygen Cardiovascular status: blood pressure returned to baseline and stable Postop Assessment: no apparent nausea or vomiting Anesthetic complications: no    DANIEL D KOVACS

## 2017-09-08 NOTE — Anesthesia Procedure Notes (Signed)
Procedure Name: Intubation Date/Time: 09/08/2017 8:55 AM Performed by: Jimmy PicketAmyot, Garnette Greb, CRNA Pre-anesthesia Checklist: Patient identified, Emergency Drugs available, Suction available, Patient being monitored and Timeout performed Patient Re-evaluated:Patient Re-evaluated prior to induction Oxygen Delivery Method: Circle system utilized Preoxygenation: Pre-oxygenation with 100% oxygen Induction Type: IV induction Ventilation: Mask ventilation without difficulty Laryngoscope Size: Miller and 2 Grade View: Grade I Tube type: Oral Rae Tube size: 7.0 mm Number of attempts: 1 Placement Confirmation: ETT inserted through vocal cords under direct vision,  positive ETCO2 and breath sounds checked- equal and bilateral Tube secured with: Tape Dental Injury: Teeth and Oropharynx as per pre-operative assessment

## 2017-09-09 ENCOUNTER — Encounter: Payer: Self-pay | Admitting: Otolaryngology

## 2017-09-12 LAB — SURGICAL PATHOLOGY

## 2021-09-20 ENCOUNTER — Ambulatory Visit
Admission: EM | Admit: 2021-09-20 | Discharge: 2021-09-20 | Disposition: A | Discharge status: Home / Self Care | Payer: BC Managed Care – PPO | Attending: Emergency Medicine | Admitting: Emergency Medicine

## 2021-09-20 ENCOUNTER — Encounter: Payer: Self-pay | Admitting: Emergency Medicine

## 2021-09-20 DIAGNOSIS — M79641 Pain in right hand: Secondary | ICD-10-CM

## 2021-09-20 MED ORDER — PREDNISONE 10 MG (21) PO TBPK: ORAL_TABLET | Freq: Every day | ORAL | 0 refills | Status: AC

## 2021-09-20 NOTE — Discharge Instructions (Addendum)
Take the prednisone as prescribed.  Rest and elevate your hand.  Apply ice packs 2-3 times a day for up to 20 minutes each.  Wear your wrist brace as needed for comfort.    Follow up with an orthopedist.

## 2021-09-20 NOTE — ED Triage Notes (Signed)
Pt here with acute on chronic exacerbation of right hand pain from a non-fracture injury in 2018. Pain is shooting up arm and down fingers. No numbness or tingling. Pt presents with brace.

## 2021-09-20 NOTE — ED Provider Notes (Signed)
Lydia Thomas    CSN: [id number] Arrival date & time: 09/20/21  0805      History   Chief Complaint Chief Complaint  Patient presents with   Hand Pain    HPI Lydia Thomas is a 31 y.o. female.  Patient presents with 2-week history of right hand pain.  She has had this pain intermittently for the past 6 years which started when she hit her hand on a table at work.  No recent injury.  She attributes her current hand pain to putting a table together 2 weeks ago which required a lot of twisting motion in her wrist; she thinks she overused it.  She has also been taking care of her niece and nephew which may be contributing to the pain since she has been picking them up a lot.  The pain is at the base of her palm; worse with finger movement; improves with rest; currently 6/10. No numbness, wounds, redness, bruising.  Treatment at home with OTC pain reliever and wrist brace. Her medical history includes ADHD and migraine headaches.    The history is provided by the patient.   Past Medical History:  Diagnosis Date   ADHD (attention deficit hyperactivity disorder)    Migraine headache    hormonal   Motion sickness    reading in cars    There are no problems to display for this patient.   Past Surgical History:  Procedure Laterality Date   ADENOIDECTOMY     APPENDECTOMY  07/14/2012   [facility], Dr. Lemar Livings   POLYPECTOMY  09/08/2017   Procedure: POLYPECTOMY NASAL;  Surgeon: Vernie Murders, MD;  Location: [facility];  Service: ENT;;   SEPTOPLASTY  10/16/2009   Dr Elenore Rota, [facility]   SEPTOPLASTY N/A 09/08/2017   Procedure: SEPTOPLASTY;  Surgeon: Vernie Murders, MD;  Location: [facility];  Service: ENT;  Laterality: N/A;   TYMPANOSTOMY TUBE PLACEMENT      OB History   No obstetric history on file.      Home Medications    Prior to Admission medications   Medication Sig Start Date End Date Taking? Authorizing Provider  predniSONE (STERAPRED UNI-PAK 21 TAB) 10  MG (21) TBPK tablet Take by mouth daily. As directed 09/20/21  Yes Mickie Bail, NP  fluticasone (FLONASE) 50 MCG/ACT nasal spray Place into both nostrils daily.    [provider]  levonorgestrel-ethinyl estradiol (ENPRESSE,TRIVORA) tablet Take 1 tablet by mouth daily.    [provider]  methylphenidate 36 MG PO CR tablet Take 36 mg by mouth daily.    [provider]  Multiple Vitamin (MULTIVITAMIN) tablet Take 1 tablet by mouth daily.    [provider]    Family History History reviewed. No pertinent family history.  Social History Social History   Tobacco Use   Smoking status: Never   Smokeless tobacco: Never  Vaping Use   Vaping Use: Never used  Substance Use Topics   Alcohol use: Yes    Alcohol/week: 3.0 standard drinks    Types: 3 Glasses of wine per week     Allergies   Vicodin [hydrocodone-acetaminophen] and Adhesive [tape]   Review of Systems Review of Systems  Constitutional:  Negative for chills and fever.  Musculoskeletal:  Positive for arthralgias. Negative for back pain, gait problem and joint swelling.  Skin:  Negative for color change, rash and wound.  Neurological:  Negative for weakness and numbness.  All other systems reviewed and are negative.  Physical Exam Triage Vital Signs ED Triage Vitals  Enc Vitals Group     BP      Pulse      Resp      Temp      Temp src      SpO2      Weight      Height      Head Circumference      Peak Flow      Pain Score      Pain Loc      Pain Edu?      Excl. in GC?    No data found.  Updated Vital Signs BP 118/60    Pulse 97    Temp 98.6 F (37 C) (Oral)    Resp 20    SpO2 98%   Visual Acuity Right Eye Distance:   Left Eye Distance:   Bilateral Distance:    Right Eye Near:   Left Eye Near:    Bilateral Near:     Physical Exam Vitals and nursing note reviewed.  Constitutional:      General: She is not in acute distress.    Appearance: Normal appearance.  She is well-developed. She is not ill-appearing.  HENT:     Mouth/Throat:     Mouth: Mucous membranes are moist.  Cardiovascular:     Rate and Rhythm: Normal rate and regular rhythm.     Heart sounds: Normal heart sounds.  Pulmonary:     Effort: Pulmonary effort is normal. No respiratory distress.     Breath sounds: Normal breath sounds.  Musculoskeletal:        General: Tenderness present. No swelling or deformity.       Hands:     Cervical back: Neck supple.  Skin:    General: Skin is warm and dry.     Capillary Refill: Capillary refill takes less than 2 seconds.     Findings: No bruising, erythema, lesion or rash.  Neurological:     General: No focal deficit present.     Mental Status: She is alert and oriented to person, place, and time.     Sensory: No sensory deficit.     Motor: No weakness.     Gait: Gait normal.  Psychiatric:        Mood and Affect: Mood normal.        Behavior: Behavior normal.     UC Treatments / Results  Labs (all labs ordered are listed, but only abnormal results are displayed) Labs Reviewed - No data to display  EKG   Radiology No results found.  Procedures Procedures (including critical care time)  Medications Ordered in UC Medications - No data to display  Initial Impression / Assessment and Plan / UC Course  I have reviewed the triage vital signs and the nursing notes.  Pertinent labs & imaging results that were available during my care of the patient were reviewed by me and considered in my medical decision making (see chart for details).    Right hand pain.  The patient 's current pain appears to be from overuse and possible flare of remote injury.  Treating today with prednisone taper.  Also instructed to treat with rest, elevation, ice packs.  Patient has her own wrist brace that she has been using.  Instructed patient to follow up with orthopedic hand specialist.  She lives out of town and is here visiting family.  Provided  contact info for local ortho; also discussed that  she can follow up with ortho of her choice in her home town.  Education provided on hand pain.  Patient agrees to plan of care.    Final Clinical Impressions(s) / UC Diagnoses   Final diagnoses:  Right hand pain     Discharge Instructions      Take the prednisone as prescribed.  Rest and elevate your hand.  Apply ice packs 2-3 times a day for up to 20 minutes each.  Wear your wrist brace as needed for comfort.    Follow up with an orthopedist.        ED Prescriptions     Medication Sig Dispense Auth. Provider   predniSONE (STERAPRED UNI-PAK 21 TAB) 10 MG (21) TBPK tablet Take by mouth daily. As directed 21 tablet Mickie Bail, NP      I have reviewed the PDMP during this encounter.   Mickie Bail, NP 09/20/21 [phone]
